# Patient Record
Sex: Female | Born: 1940 | Race: White | Hispanic: No | Marital: Married | Smoking: Never smoker
Health system: Southern US, Community
[De-identification: ages and names within clinical notes are randomized; demographics above are authoritative.]

---

## 2008-05-14 ENCOUNTER — Ambulatory Visit: Payer: Self-pay | Admitting: Internal Medicine

## 2014-08-26 DIAGNOSIS — M5137 Other intervertebral disc degeneration, lumbosacral region: Secondary | ICD-10-CM | POA: Insufficient documentation

## 2015-03-03 DIAGNOSIS — E038 Other specified hypothyroidism: Secondary | ICD-10-CM | POA: Insufficient documentation

## 2015-04-07 ENCOUNTER — Other Ambulatory Visit: Payer: Self-pay | Admitting: Physical Medicine and Rehabilitation

## 2015-04-07 DIAGNOSIS — M5417 Radiculopathy, lumbosacral region: Secondary | ICD-10-CM

## 2015-05-03 ENCOUNTER — Ambulatory Visit
Admission: RE | Admit: 2015-05-03 | Discharge: 2015-05-03 | Disposition: A | Discharge status: Home / Self Care | Payer: Medicare Other | Source: Ambulatory Visit | Attending: Physical Medicine and Rehabilitation | Admitting: Physical Medicine and Rehabilitation

## 2015-05-03 DIAGNOSIS — M5417 Radiculopathy, lumbosacral region: Secondary | ICD-10-CM

## 2015-05-11 DIAGNOSIS — M48062 Spinal stenosis, lumbar region with neurogenic claudication: Secondary | ICD-10-CM | POA: Insufficient documentation

## 2015-05-11 DIAGNOSIS — M5416 Radiculopathy, lumbar region: Secondary | ICD-10-CM | POA: Insufficient documentation

## 2016-03-27 ENCOUNTER — Other Ambulatory Visit: Payer: Self-pay | Admitting: Nurse Practitioner

## 2016-03-27 DIAGNOSIS — Z1231 Encounter for screening mammogram for malignant neoplasm of breast: Secondary | ICD-10-CM

## 2016-04-05 ENCOUNTER — Ambulatory Visit
Admission: RE | Admit: 2016-04-05 | Discharge: 2016-04-05 | Disposition: A | Discharge status: Home / Self Care | Payer: Medicare Other | Source: Ambulatory Visit | Attending: Nurse Practitioner | Admitting: Nurse Practitioner

## 2016-04-05 DIAGNOSIS — Z1231 Encounter for screening mammogram for malignant neoplasm of breast: Secondary | ICD-10-CM | POA: Diagnosis present

## 2017-04-11 ENCOUNTER — Other Ambulatory Visit: Payer: Self-pay | Admitting: Nurse Practitioner

## 2017-04-11 DIAGNOSIS — Z1231 Encounter for screening mammogram for malignant neoplasm of breast: Secondary | ICD-10-CM

## 2020-04-22 DIAGNOSIS — R7302 Impaired glucose tolerance (oral): Secondary | ICD-10-CM | POA: Insufficient documentation

## 2020-05-23 DIAGNOSIS — I6521 Occlusion and stenosis of right carotid artery: Secondary | ICD-10-CM | POA: Insufficient documentation

## 2020-08-03 ENCOUNTER — Encounter (INDEPENDENT_AMBULATORY_CARE_PROVIDER_SITE_OTHER): Payer: Self-pay | Admitting: Vascular Surgery

## 2020-08-24 ENCOUNTER — Encounter (INDEPENDENT_AMBULATORY_CARE_PROVIDER_SITE_OTHER): Payer: Self-pay | Admitting: Vascular Surgery

## 2020-08-31 ENCOUNTER — Other Ambulatory Visit: Payer: Self-pay

## 2020-08-31 ENCOUNTER — Ambulatory Visit (INDEPENDENT_AMBULATORY_CARE_PROVIDER_SITE_OTHER): Payer: Medicare Other | Admitting: Vascular Surgery

## 2020-08-31 ENCOUNTER — Encounter (INDEPENDENT_AMBULATORY_CARE_PROVIDER_SITE_OTHER): Payer: Self-pay | Admitting: Vascular Surgery

## 2020-08-31 VITALS — BP 157/74 | HR 78 | Ht 64.0 in | Wt 143.0 lb

## 2020-08-31 DIAGNOSIS — I10 Essential (primary) hypertension: Secondary | ICD-10-CM | POA: Insufficient documentation

## 2020-08-31 DIAGNOSIS — G4700 Insomnia, unspecified: Secondary | ICD-10-CM | POA: Insufficient documentation

## 2020-08-31 DIAGNOSIS — E785 Hyperlipidemia, unspecified: Secondary | ICD-10-CM | POA: Insufficient documentation

## 2020-08-31 DIAGNOSIS — F1190 Opioid use, unspecified, uncomplicated: Secondary | ICD-10-CM | POA: Insufficient documentation

## 2020-08-31 DIAGNOSIS — I6521 Occlusion and stenosis of right carotid artery: Secondary | ICD-10-CM

## 2020-08-31 NOTE — Progress Notes (Signed)
Patient ID: Kelli Bowen, female   DOB: 06/11/1941, 79 y.o.   MRN: [id number]  No chief complaint on file.   HPI Kelli Bowen is a 79 y.o. female.  I am asked to see the patient by Karna Dupes, NP for evaluation of carotid stenosis.  The patient reports no symptoms of cerebrovascular ischemia. Specifically, the patient denies amaurosis fugax, speech or swallowing difficulties, or arm or leg weakness or numbness. On her regular office visit they noted a bruit in the right neck which prompted a duplex which I have reviewed. This has significantly elevated velocities that would be consistent with a 70 to 99% stenosis of the right carotid artery. The left carotid artery had minimal stenosis. She is on a statin agent. She is referred for further evaluation and treatment.    Past Medical History Hyperlipidemia Arthritis Hypertension  No past surgical history on file.  Family History No bleeding disorders, clotting disorders, autoimmune diseases, or aneurysms.  Social History No alcohol, drug use, or tobacco. Retired.   Current Outpatient Medications  Medication Sig Dispense Refill  . atorvastatin (LIPITOR) 40 MG tablet Take 1 tablet by mouth daily.    Marland Kitchen atorvastatin (LIPITOR) 40 MG tablet Take 40 mg by mouth daily.    . Calcium Carbonate-Vitamin D 600-200 MG-UNIT TABS Take 1 tablet by mouth daily.    . clonazePAM (KLONOPIN) 0.25 MG disintegrating tablet Take by mouth.    . cyanocobalamin 1000 MCG tablet Take by mouth.    . diclofenac (VOLTAREN) 75 MG EC tablet Take by mouth.    . gabapentin (NEURONTIN) 100 MG capsule TAKE 2 CAPSULES BY MOUTH EVERY NIGHT    . levothyroxine (SYNTHROID) 75 MCG tablet TAKE 1 TABLET BY MOUTH 3 TIMES A WEEK ALTERNATING WITH 50 MCG ON OTHER 4 DAYS PER WEEK    . Multiple Vitamin (MULTI-VITAMIN) tablet Take 1 tablet by mouth daily.    . traMADol (ULTRAM) 50 MG tablet Take 50 mg by mouth 3 (three) times daily as needed.    . traZODone (DESYREL) 50 MG  tablet Take 50 mg by mouth at bedtime.    . triamterene-hydrochlorothiazide (DYAZIDE) 37.5-25 MG capsule Take 1 capsule by mouth daily.     No current facility-administered medications for this visit.      REVIEW OF SYSTEMS (Negative unless checked)  Constitutional: [] Weight loss  [] Fever  [] Chills Cardiac: [] Chest pain   [] Chest pressure   [] Palpitations   [] Shortness of breath when laying flat   [] Shortness of breath at rest   [] Shortness of breath with exertion. Vascular:  [] Pain in legs with walking   [] Pain in legs at rest   [] Pain in legs when laying flat   [] Claudication   [] Pain in feet when walking  [] Pain in feet at rest  [] Pain in feet when laying flat   [] History of DVT   [] Phlebitis   [] Swelling in legs   [] Varicose veins   [] Non-healing ulcers Pulmonary:   [] Uses home oxygen   [] Productive cough   [] Hemoptysis   [] Wheeze  [] COPD   [] Asthma Neurologic:  [] Dizziness  [] Blackouts   [] Seizures   [] History of stroke   [] History of TIA  [] Aphasia   [] Temporary blindness   [] Dysphagia   [] Weakness or numbness in arms   [] Weakness or numbness in legs Musculoskeletal:  [x] Arthritis   [] Joint swelling   [] Joint pain   [] Low back pain Hematologic:  [] Easy bruising  [] Easy bleeding   [] Hypercoagulable state   [] Anemic  []   Hepatitis Gastrointestinal:  [] Blood in stool   [] Vomiting blood  [x] Gastroesophageal reflux/heartburn   [] Abdominal pain Genitourinary:  [] Chronic kidney disease   [] Difficult urination  [] Frequent urination  [] Burning with urination   [] Hematuria Skin:  [] Rashes   [] Ulcers   [] Wounds Psychological:  [] History of anxiety   []  History of major depression.    Physical Exam BP (!) 157/74   Pulse 78   Ht 5\' 4"  (1.626 m)   Wt 143 lb (64.9 kg)   BMI 24.55 kg/m  Gen:  WD/WN, NAD. Appears younger than stated age. Head: [place]/AT, No temporalis wasting. Ear/Nose/Throat: Hearing grossly intact, nares w/o erythema or drainage, oropharynx w/o Erythema/Exudate Eyes: Conjunctiva  clear, sclera non-icteric  Neck: trachea midline.  Right carotid bruit is present. Pulmonary:  Good air movement, clear to auscultation bilaterally.  Cardiac: RRR, normal S1, S2 Vascular:  Vessel Right Left  Radial Palpable Palpable               Musculoskeletal: M/S 5/5 throughout.  Extremities without ischemic changes.  No deformity or atrophy. No edema. Neurologic: Sensation grossly intact in extremities.  Symmetrical.  Speech is fluent. Motor exam as listed above. Psychiatric: Judgment intact, Mood & affect appropriate for pt's clinical situation. Dermatologic: No rashes or ulcers noted.  No cellulitis or open wounds.    Radiology No results found.  Labs No results found for this or any previous visit (from the past 2160 hour(s)).  Assessment/Plan:  Carotid stenosis, right The patient remains asymptomatic with respect to the carotid stenosis.  However, the patient has now progressed and has a lesion the is >70%.  Patient should undergo CT angiography of the carotid arteries to define the degree of stenosis of the internal carotid arteries bilaterally and the anatomic suitability for surgery vs. intervention.  If the patient does indeed need surgery cardiac clearance will be required, once cleared the patient will be scheduled for surgery.  The risks, benefits and alternative therapies were reviewed in detail with the patient.  All questions were answered.  The patient agrees to proceed with imaging.  Continue antiplatelet therapy as prescribed. Continue management of CAD, HTN and Hyperlipidemia. Healthy heart diet, encouraged exercise at least 4 times per week.    Hypertension blood pressure control important in reducing the progression of atherosclerotic disease. On appropriate oral medications.   Hyperlipidemia lipid control important in reducing the progression of atherosclerotic disease. Continue statin therapy       08/31/2020, 5:04 PM   This  note was created with Dragon medical transcription system.  Any errors from dictation are unintentional.

## 2020-08-31 NOTE — Patient Instructions (Signed)
Carotid Artery Disease  Carotid artery disease is the narrowing or blockage of one or both carotid arteries. This condition is also called carotid artery stenosis. The carotid arteries are the two main blood vessels on either side of the neck. They send blood to the brain, other parts of the head, and the neck.  This condition increases your risk for a stroke or a transient ischemic attack (TIA). A TIA is a "mini-stroke" that causes stroke-like symptoms that go away quickly. What are the causes? This condition is mainly caused by a narrowing and hardening of the carotid arteries. The carotid arteries can become narrow or clogged with a buildup of plaque. Plaque includes:  Fat.  Cholesterol.  Calcium.  Other substances. What increases the risk? The following factors may make you more likely to develop this condition:  Having certain medical conditions, such as: ? High cholesterol. ? High blood pressure. ? Diabetes. ? Obesity.  Smoking.  A family history of cardiovascular disease.  Not being active or lack of regular exercise.  Being female. Men have a higher risk of having arteries become narrow and harden earlier in life than women.  Old age. What are the signs or symptoms? This condition may not have any signs or symptoms until a stroke or TIA happens. In some cases, your doctor may be able to hear a whooshing sound. This can suggest a change in blood flow caused by plaque buildup. An eye exam can also help find signs of the condition. How is this treated? This condition may be treated with more than one treatment. Treatment options include:  Lifestyle changes, such as: ? Quitting smoking. ? Getting regular exercise, or getting exercise as told by your doctor. ? Eating a healthy diet. ? Managing stress. ? Keeping a healthy weight.  Medicines to control: ? Blood pressure. ? Cholesterol. ? Blood clotting.  Surgery. You may have: ? A surgery to remove the blockages in  the carotid arteries. ? A procedure in which a small mesh tube (stent) is used to widen the blocked carotid arteries. Follow these instructions at home: Eating and drinking Follow instructions about your diet from your doctor. It is important to follow a healthy diet.  Eat a diet that includes: ? A lot of fresh fruits and vegetables. ? Low-fat (lean) meats.  Avoid these foods: ? Foods that are high in fat. ? Foods that are high in salt (sodium). ? Foods that are fried. ? Foods that are processed. ? Foods that have few good nutrients (poor nutritional value).  Lifestyle   Keep a healthy weight.  Do exercises as told by your doctor to stay active. Each week, you should get one of the following: ? At least 150 minutes of exercise that raises your heart rate and makes you sweat (moderate-intensity exercise). ? At least 75 minutes of exercise that takes a lot of effort.  Do not use any products that contain nicotine or tobacco, such as cigarettes, e-cigarettes, and chewing tobacco. If you need help quitting, ask your doctor.  Do not drink alcohol if: ? Your doctor tells you not to drink. ? You are pregnant, may be pregnant, or are planning to become pregnant.  If you drink alcohol: ? Limit how much you use to:  0-1 drink a day for women.  0-2 drinks a day for men. ? Be aware of how much alcohol is in your drink. In the [place] one drink equals one 12 oz bottle of beer (355 mL), one 5   oz glass of wine (148 mL), or one 1 oz glass of hard liquor (44 mL).  Do not use drugs.  Manage your stress. Ask your doctor for tips on how to do this. General instructions  Take over-the-counter and prescription medicines only as told by your doctor.  Keep all follow-up visits as told by your doctor. This is important. Where to find more information  American Heart Association: www.heart.org Get help right away if:  You have any signs of a stroke. "BE FAST" is an easy way to remember the  main warning signs: ? B - Balance. Signs are dizziness, sudden trouble walking, or loss of balance. ? E - Eyes. Signs are trouble seeing or a change in how you see. ? F - Face. Signs are sudden weakness or loss of feeling of the face, or the face or eyelid drooping on one side. ? A - Arms. Signs are weakness or loss of feeling in an arm. This happens suddenly and usually on one side of the body. ? S - Speech. Signs are sudden trouble speaking, slurred speech, or trouble understanding what people say. ? T - Time. Time to call emergency services. Write down what time symptoms started.  You have other signs of a stroke, such as: ? A sudden, very bad headache with no known cause. ? Feeling like you may vomit (nausea). ? Vomiting. ? A seizure. These symptoms may be an emergency. Do not wait to see if the symptoms will go away. Get medical help right away. Call your local emergency services (911 in the [place] Do not drive yourself to the hospital. Summary  The carotid arteries are blood vessels on both sides of the neck.  If these arteries get smaller or get blocked, you are more likely to have a stroke or a mini-stroke.  This condition can be treated with lifestyle changes, medicines, surgery, or a blend of these treatments.  Get help right away if you have any signs of a stroke. "BE FAST" is an easy way to remember the main warning signs of stroke. This information is not intended to replace advice given to you by your health care provider. Make sure you discuss any questions you have with your health care provider. Document Revised: 05/05/2019 Document Reviewed: 05/05/2019 Elsevier Patient Education  2020 [employer]  

## 2020-08-31 NOTE — Assessment & Plan Note (Signed)
lipid control important in reducing the progression of atherosclerotic disease. Continue statin therapy  

## 2020-08-31 NOTE — Assessment & Plan Note (Signed)
blood pressure control important in reducing the progression of atherosclerotic disease. On appropriate oral medications.  

## 2020-08-31 NOTE — Assessment & Plan Note (Signed)
The patient remains asymptomatic with respect to the carotid stenosis.  However, the patient has now progressed and has a lesion the is >70%.  Patient should undergo CT angiography of the carotid arteries to define the degree of stenosis of the internal carotid arteries bilaterally and the anatomic suitability for surgery vs. intervention.  If the patient does indeed need surgery cardiac clearance will be required, once cleared the patient will be scheduled for surgery.  The risks, benefits and alternative therapies were reviewed in detail with the patient.  All questions were answered.  The patient agrees to proceed with imaging.  Continue antiplatelet therapy as prescribed. Continue management of CAD, HTN and Hyperlipidemia. Healthy heart diet, encouraged exercise at least 4 times per week.   

## 2020-09-09 ENCOUNTER — Ambulatory Visit: Payer: Medicare Other

## 2020-09-10 ENCOUNTER — Ambulatory Visit
Admission: RE | Admit: 2020-09-10 | Discharge: 2020-09-10 | Disposition: A | Discharge status: Home / Self Care | Payer: Medicare Other | Source: Ambulatory Visit | Attending: Vascular Surgery | Admitting: Vascular Surgery

## 2020-09-10 ENCOUNTER — Other Ambulatory Visit: Payer: Self-pay

## 2020-09-10 DIAGNOSIS — I6521 Occlusion and stenosis of right carotid artery: Secondary | ICD-10-CM | POA: Insufficient documentation

## 2020-09-10 LAB — POCT I-STAT CREATININE: Creatinine, Ser: 0.9 mg/dL (ref 0.44–1.00)

## 2020-09-10 MED ORDER — IOHEXOL 350 MG/ML SOLN
75.0000 mL | Freq: Once | INTRAVENOUS | Status: AC | PRN
  Administered 2020-09-10: 75 mL via INTRAVENOUS

## 2020-09-21 ENCOUNTER — Other Ambulatory Visit: Payer: Self-pay

## 2020-09-21 ENCOUNTER — Ambulatory Visit (INDEPENDENT_AMBULATORY_CARE_PROVIDER_SITE_OTHER): Payer: Medicare Other | Admitting: Vascular Surgery

## 2020-09-21 ENCOUNTER — Encounter (INDEPENDENT_AMBULATORY_CARE_PROVIDER_SITE_OTHER): Payer: Self-pay | Admitting: Vascular Surgery

## 2020-09-21 VITALS — BP 169/76 | HR 75 | Resp 16 | Ht 64.5 in | Wt 146.6 lb

## 2020-09-21 DIAGNOSIS — I6521 Occlusion and stenosis of right carotid artery: Secondary | ICD-10-CM

## 2020-09-21 DIAGNOSIS — E785 Hyperlipidemia, unspecified: Secondary | ICD-10-CM | POA: Diagnosis not present

## 2020-09-21 DIAGNOSIS — I10 Essential (primary) hypertension: Secondary | ICD-10-CM

## 2020-09-21 NOTE — Patient Instructions (Signed)
Angiogram  An angiogram is a procedure used to examine the blood vessels. In this procedure, contrast dye is injected through a long, thin tube (catheter) into an artery. X-rays are then taken, which show if there is a blockage or problem in a blood vessel. The catheter may be inserted in:  The groin area. This is the most common.  The fold of the arm, near the elbow.  The wrist. Tell a health care provider about:  Any allergies you have, including allergies to medicines, shellfish, contrast dye, or iodine.  All medicines you are taking, including vitamins, herbs, eye drops, creams, and over-the-counter medicines.  Any problems you or family members have had with anesthetic medicines.  Any blood disorders you have.  Any surgeries you have had.  Any medical conditions you have or have had, including any kidney problems or kidney failure.  Whether you are pregnant or may be pregnant.  Whether you are breastfeeding. What are the risks? Generally, this is a safe procedure. However, problems may occur, including:  Infection.  Bleeding and bruising.  Allergic reactions to medicines or dyes, including the contrast dye used.  Damage to nearby structures or organs, including damage to blood vessels and kidney damage from the contrast dye.  Blood clots that can lead to a stroke or heart attack. What happens before the procedure? Staying hydrated Follow instructions from your health care provider about hydration, which may include:  Up to 2 hours before the procedure - you may continue to drink clear liquids, such as water, clear fruit juice, black coffee, and plain tea.  Eating and drinking restrictions Follow instructions from your health care provider about eating and drinking, which may include:  8 hours before the procedure - stop eating heavy meals or foods, such as meat, fried foods, or fatty foods.  6 hours before the procedure - stop eating light meals or foods, such  as toast or cereal.  2 hours before the procedure - stop drinking clear liquids. Medicines Ask your health care provider about:  Changing or stopping your regular medicines. This is especially important if you are taking diabetes medicines or blood thinners.  Taking medicines such as aspirin and ibuprofen. These medicines can thin your blood. Do not take these medicines unless your health care provider tells you to take them.  Taking over-the-counter medicines, vitamins, herbs, and supplements. Surgery safety Ask your health care provider:  How your insertion site will be marked.  What steps will be taken to help prevent infection. These may include: ? Removing hair at the insertion site. ? Washing skin with a germ-killing soap. ? Taking antibiotic medicine. General instructions  Do not use any products that contain nicotine or tobacco for at least 4 weeks before the procedure. These products include cigarettes, e-cigarettes, and chewing tobacco. If you need help quitting, ask your health care provider.  You may have blood samples taken.  Plan to have someone take you home from the hospital or clinic.  If you will be going home right after the procedure, plan to have someone with you for 24 hours. What happens during the procedure?  You will lie on your back on an X-ray table. You may be strapped to the table if it is tilted.  An IV will be inserted into one of your veins.  Electrodes may be placed on your chest to monitor your heart rate during the procedure.  You will be given one or both of the following: ? A medicine to   help you relax (sedative). ? A medicine to numb the area where the catheter will be inserted (local anesthetic).  A small incision will be made for catheter insertion.  The catheter will be inserted into an artery using a guide wire. An X-ray procedure (fluoroscopy) will be used to help guide the catheter to the blood vessel to be examined.  A contrast  dye will then be injected into the catheter, and X-rays will be taken. The contrast will help to show where any narrowing or blockages are located in the blood vessels. You may feel flushed as the contrast dye is injected.  Tell your health care provider if you develop chest pain or trouble breathing.  After the fluoroscopy is complete, the catheter will be removed.  A bandage (dressing) will be placed over the site where the catheter was inserted. Pressure will be applied to help stop any bleeding.  The IV will be removed. The procedure may vary among health care providers and hospitals. What happens after the procedure?  Your blood pressure, heart rate, breathing rate, and blood oxygen level will be monitored until you leave the hospital or clinic.  You will be kept in bed lying flat for 6 hours. If the catheter was inserted through your leg, you will be instructed not to bend or cross your legs.  The insertion area and the pulse in your feet or wrist will be checked frequently.  You will be instructed to drink plenty of fluids. This will help wash the contrast dye out of your body.  You may have more blood tests and X-rays. You may also have a test that records the electrical activity of your heart (electrocardiogram, or ECG).  Do not drive for 24 hours if you were given a sedative during your procedure.  It is up to you to get the results of your procedure. Ask your health care provider, or the department that is doing the procedure, when your results will be ready. Summary  An angiogram is a procedure used to examine the blood vessels.  Before the procedure, follow your health care provider's instructions about eating and drinking restrictions. You may be asked to stop eating and drinking several hours before the procedure.  During the procedure, contrast dye is injected through a thin tube (catheter) into an artery. X-rays are then taken.  After the procedure, you will need to  drink plenty of fluids and lie flat for 6 hour. This information is not intended to replace advice given to you by your health care provider. Make sure you discuss any questions you have with your health care provider. Document Revised: 04/23/2019 Document Reviewed: 04/23/2019 Elsevier Patient Education  2020 [employer]  

## 2020-09-21 NOTE — Progress Notes (Signed)
MRN : [id number]  Kelli Bowen is a 79 y.o. ([birth year]) female who presents with chief complaint of  Chief Complaint  Patient presents with  . Follow-up    ct results  .  History of Present Illness: Patient returns today in follow up of her carotid disease.  She remains asymptomatic since her last visit with no new symptoms.  She is doing well today.  She continues on aspirin and Lipitor.  No new complaints. I have independently reviewed her CT angiogram.  The official report was of less than 50% carotid artery stenosis bilaterally.  Although the right carotid artery does not appear as high-grade as the ultrasound suggesting, I do believe it is significantly more than very mild disease that is described in the official report.  I would estimate somewhere in the 60 to 70% range if not a little worse.  Current Outpatient Medications  Medication Sig Dispense Refill  . aspirin EC 81 MG tablet Take 81 mg by mouth daily. Swallow whole.    Marland Kitchen atorvastatin (LIPITOR) 40 MG tablet Take 1 tablet by mouth daily.    Marland Kitchen atorvastatin (LIPITOR) 40 MG tablet Take 40 mg by mouth daily.    . Calcium Carbonate-Vitamin D 600-200 MG-UNIT TABS Take 1 tablet by mouth daily.    . clonazePAM (KLONOPIN) 0.25 MG disintegrating tablet Take by mouth.    . cyanocobalamin 1000 MCG tablet Take by mouth.    . diclofenac (VOLTAREN) 75 MG EC tablet Take by mouth.    . gabapentin (NEURONTIN) 100 MG capsule TAKE 2 CAPSULES BY MOUTH EVERY NIGHT    . levothyroxine (SYNTHROID) 75 MCG tablet TAKE 1 TABLET BY MOUTH 3 TIMES A WEEK ALTERNATING WITH 50 MCG ON OTHER 4 DAYS PER WEEK    . Multiple Vitamin (MULTI-VITAMIN) tablet Take 1 tablet by mouth daily.    . traMADol (ULTRAM) 50 MG tablet Take 50 mg by mouth 3 (three) times daily as needed.    . traZODone (DESYREL) 50 MG tablet Take 50 mg by mouth at bedtime.    . triamterene-hydrochlorothiazide (DYAZIDE) 37.5-25 MG capsule Take 1 capsule by mouth daily.     No current  facility-administered medications for this visit.    No past medical history on file.  No past surgical history on file.   Social History   Tobacco Use  . Smoking status: Never Smoker  . Smokeless tobacco: Never Used  Substance Use Topics  . Alcohol use: Not on file  . Drug use: Not on file  married    Family History  Problem Relation Age of Onset  . Breast cancer Neg Hx   No bleeding or clotting disorders No aneurysms   No Known Allergies   REVIEW OF SYSTEMS (Negative unless checked)  Constitutional: [] Weight loss  [] Fever  [] Chills Cardiac: [] Chest pain   [] Chest pressure   [] Palpitations   [] Shortness of breath when laying flat   [] Shortness of breath at rest   [] Shortness of breath with exertion. Vascular:  [] Pain in legs with walking   [] Pain in legs at rest   [] Pain in legs when laying flat   [] Claudication   [] Pain in feet when walking  [] Pain in feet at rest  [] Pain in feet when laying flat   [] History of DVT   [] Phlebitis   [] Swelling in legs   [] Varicose veins   [] Non-healing ulcers Pulmonary:   [] Uses home oxygen   [] Productive cough   [] Hemoptysis   [] Wheeze  [] COPD   []   Asthma Neurologic:  [] Dizziness  [] Blackouts   [] Seizures   [] History of stroke   [] History of TIA  [] Aphasia   [] Temporary blindness   [] Dysphagia   [] Weakness or numbness in arms   [] Weakness or numbness in legs Musculoskeletal:  [x] Arthritis   [] Joint swelling   [x] Joint pain   [] Low back pain Hematologic:  [] Easy bruising  [] Easy bleeding   [] Hypercoagulable state   [] Anemic   Gastrointestinal:  [] Blood in stool   [] Vomiting blood  [x] Gastroesophageal reflux/heartburn   [] Abdominal pain Genitourinary:  [] Chronic kidney disease   [] Difficult urination  [] Frequent urination  [] Burning with urination   [] Hematuria Skin:  [] Rashes   [] Ulcers   [] Wounds Psychological:  [] History of anxiety   []  History of major depression.  Physical Examination  BP (!) 169/76 (BP Location: Right Arm)   Pulse  75   Resp 16   Ht 5' 4.5" (1.638 m)   Wt 146 lb 9.6 oz (66.5 kg)   BMI 24.78 kg/m  Gen:  WD/WN, NAD Head: [place]/AT, No temporalis wasting. Ear/Nose/Throat: Hearing grossly intact, nares w/o erythema or drainage Eyes: Conjunctiva clear. Sclera non-icteric Neck: Supple.  Trachea midline Pulmonary:  Good air movement, no use of accessory muscles.  Cardiac: RRR, no JVD Vascular:  Vessel Right Left  Radial Palpable Palpable           Musculoskeletal: M/S 5/5 throughout.  No deformity or atrophy. No edema. Neurologic: Sensation grossly intact in extremities.  Symmetrical.  Speech is fluent.  Psychiatric: Judgment intact, Mood & affect appropriate for pt's clinical situation. Dermatologic: No rashes or ulcers noted.  No cellulitis or open wounds.       Labs Recent Results (from the past 2160 hour(s))  I-STAT creatinine     Status: None   Collection Time: 09/10/20  4:18 PM  Result Value Ref Range   Creatinine, Ser 0.90 0.44 - 1.00 mg/dL    Radiology CT Angio Neck W/Cm &/Or Wo/Cm  Result Date: 09/11/2020 CLINICAL DATA:  Carotid stenosis screening. EXAM: CT ANGIOGRAPHY NECK TECHNIQUE: Multidetector CT imaging of the neck was performed using the standard protocol during bolus administration of intravenous contrast. Multiplanar CT image reconstructions and MIPs were obtained to evaluate the vascular anatomy. Carotid stenosis measurements (when applicable) are obtained utilizing NASCET criteria, using the distal internal carotid diameter as the denominator. CONTRAST:  87mL OMNIPAQUE IOHEXOL 350 MG/ML SOLN COMPARISON:  Carotid duplex June 08, 2020. FINDINGS: Aortic arch: Only partially visualized. Origin of innominate artery out of the field of view. No stenosis at the origin of the left subclavian and left common carotid arteries. Right carotid system: Atherosclerotic changes of the right carotid bifurcation with approximately 40% stenosis at the distal right common carotid artery. Left  carotid system: Atherosclerotic changes of the left carotid bifurcation without hemodynamically significant stenosis. Vertebral arteries: Atherosclerotic changes at the origin of the right vertebral artery resulting in mild stenosis with increased tortuosity of the V1 segment. Decrease caliber of the right vertebral artery from the V3-4 junction. The left vertebral artery is dominant. Atherosclerotic changes at the origin of the left vertebral artery without significant stenosis. Increased tortuosity of the V1 segment. Skeleton: Advanced degenerative changes of the cervical spine. Other neck: Heterogeneous enhancement of the thyroid gland. Upper chest: Biapical pleural pulmonary fibrosis. IMPRESSION: 1. Atherosclerotic changes at the carotid bifurcations bilaterally with less than 50% stenosis at the distal right common carotid artery. No hemodynamically significant stenosis at the left ICA. 2. Mild stenosis at the origin of the  right vertebral artery. Decrease caliber of the intracranial segment of the right vertebral artery. 3. Heterogeneous enhancement of the thyroid gland. Recommend thyroid ultrasound for further evaluation. Electronically Signed   By: Baldemar Lenis M.D.   On: 09/11/2020 17:18    Assessment/Plan Hypertension blood pressure control important in reducing the progression of atherosclerotic disease. On appropriate oral medications.   Hyperlipidemia lipid control important in reducing the progression of atherosclerotic disease. Continue statin therapy  Carotid stenosis, right I have independently reviewed her CT angiogram.  The official report was of less than 50% carotid artery stenosis bilaterally.  Although the right carotid artery does not appear as high-grade as the ultrasound suggesting, I do believe it is significantly more than very mild disease that is described in the official report.  I had a good long discussion with she and her family today regarding the  disparate findings between ultrasound and CT scan.  I believe that we have 2 options in this situation.  1 would be a repeat carotid duplex by a dedicated vascular technologist in our office that I have more faith in.  That still may lead to disparate findings however.  The second option would be an angiogram of the carotid arteries for further evaluation.  If significant disease of greater than 75% is present, she would have anatomy that would be amenable to stenting at that time.  I discussed the risks and benefits of angiogram and possible stent placement.  I discussed the reason and rationale to gather more information for definitive treatment.  She and her family would like to proceed with the angiogram and possible stent placement after the holidays.    Festus Barren, MD  09/21/2020 4:19 PM    This note was created with Dragon medical transcription system.  Any errors from dictation are purely unintentional

## 2020-09-21 NOTE — Assessment & Plan Note (Signed)
I have independently reviewed her CT angiogram.  The official report was of less than 50% carotid artery stenosis bilaterally.  Although the right carotid artery does not appear as high-grade as the ultrasound suggesting, I do believe it is significantly more than very mild disease that is described in the official report.  I had a good long discussion with she and her family today regarding the disparate findings between ultrasound and CT scan.  I believe that we have 2 options in this situation.  1 would be a repeat carotid duplex by a dedicated vascular technologist in our office that I have more faith in.  That still may lead to disparate findings however.  The second option would be an angiogram of the carotid arteries for further evaluation.  If significant disease of greater than 75% is present, she would have anatomy that would be amenable to stenting at that time.  I discussed the risks and benefits of angiogram and possible stent placement.  I discussed the reason and rationale to gather more information for definitive treatment.  She and her family would like to proceed with the angiogram and possible stent placement after the holidays.

## 2020-09-22 ENCOUNTER — Telehealth (INDEPENDENT_AMBULATORY_CARE_PROVIDER_SITE_OTHER): Payer: Self-pay

## 2020-09-22 NOTE — Telephone Encounter (Signed)
I attempted to contact the patient and a message was left for a return call. 

## 2020-09-30 NOTE — Telephone Encounter (Signed)
I attempted to contact the patient and a message was left on her home voice mail box for a return call. I attempted to contact the mobile number and the voicemail box is full.

## 2021-04-08 IMAGING — CT CT ANGIO NECK
2 of 7 series · 7 of 33 positions shown · IV contrast (omnipaque)
Comparison: Carotid duplex June 08, 2020.

CLINICAL DATA: Carotid stenosis screening.

EXAM:
CT ANGIOGRAPHY NECK
TECHNIQUE: Multidetector CT imaging of the neck was performed using the
standard protocol during bolus administration of intravenous
contrast. Multiplanar CT image reconstructions and MIPs were
obtained to evaluate the vascular anatomy. Carotid stenosis
measurements (when applicable) are obtained utilizing NASCET
criteria, using the distal internal carotid diameter as the
denominator.
CONTRAST:  75mL OMNIPAQUE IOHEXOL 350 MG/ML SOLN

[Series 7: ax thin mips cta neck 1.00 ax · axial · 0.40mm/px · z∈[-729,-530]mm · 6 of 328 slices shown]
[im 47/328  soft-tissue]
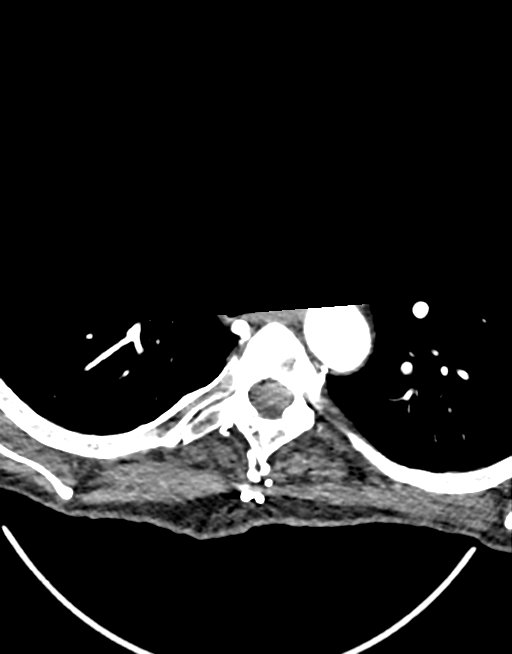
[im 94/328  bone]
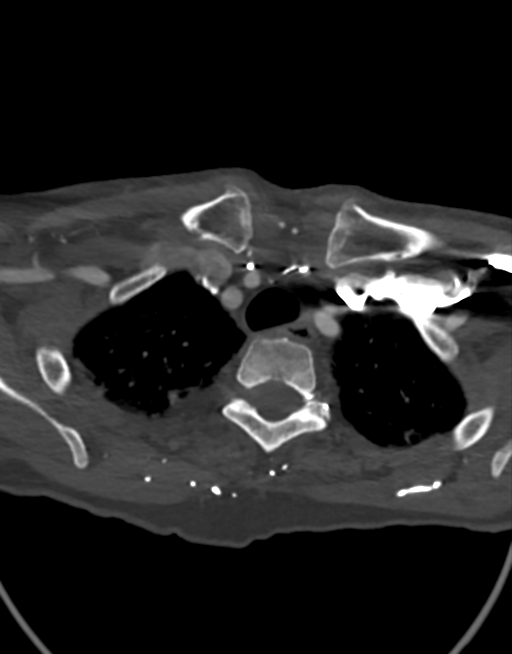
[im 141/328  soft-tissue]
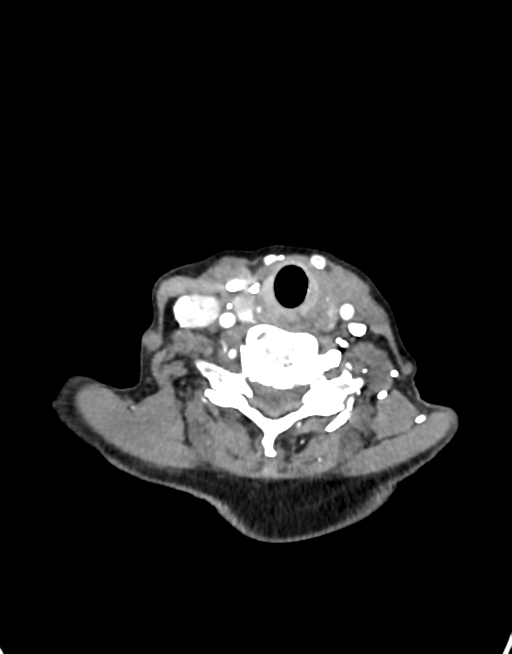
[im 187/328  bone]
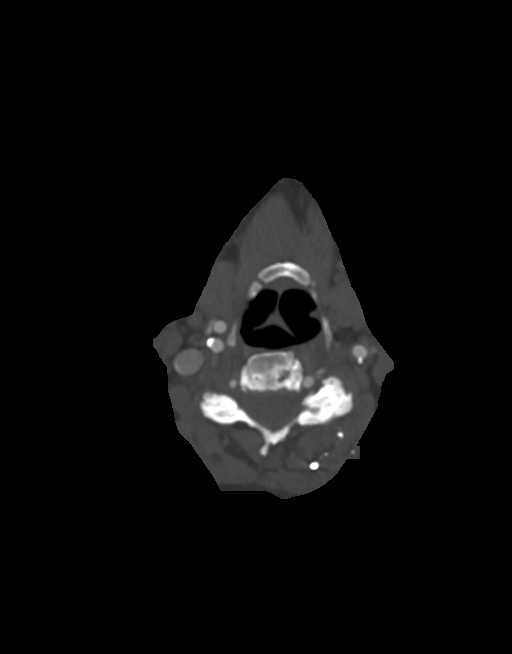
[im 234/328  soft-tissue]
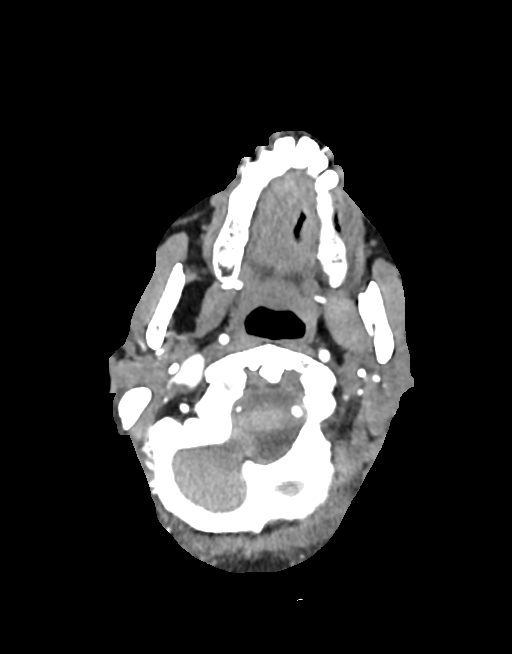
[im 281/328  bone]
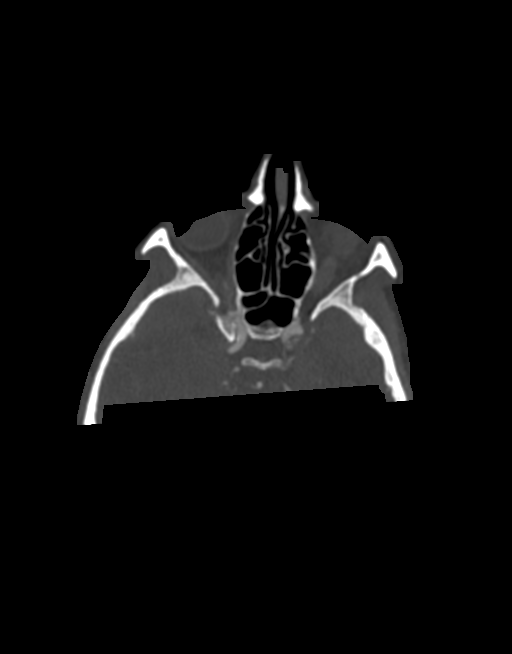

[Series 11: sag thin mips cta neck 1.00 sag · sagittal · 0.51mm/px · 1 of 204 slices shown]
[im 102/204  soft-tissue]
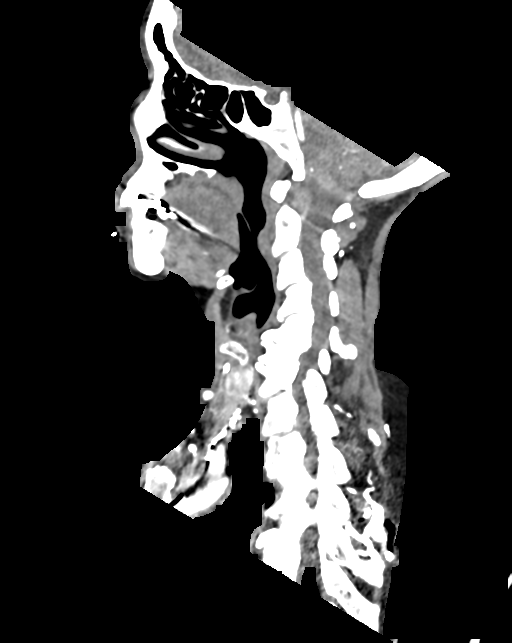

[7 of 33 positions shown; findings below may reference images not displayed]

FINDINGS: Aortic arch: Only partially visualized. Origin of innominate artery
out of the field of view. No stenosis at the origin of the left
subclavian and left common carotid arteries.

Right carotid system: Atherosclerotic changes of the right carotid
bifurcation with approximately 40% stenosis at the distal right
common carotid artery.

Left carotid system: Atherosclerotic changes of the left carotid
bifurcation without hemodynamically significant stenosis.

Vertebral arteries: Atherosclerotic changes at the origin of the
right vertebral artery resulting in mild stenosis with increased
tortuosity of the V1 segment. Decrease caliber of the right
vertebral artery from the [REDACTED] junction. The left vertebral artery
is dominant.

Atherosclerotic changes at the origin of the left vertebral artery
without significant stenosis. Increased tortuosity of the V1
segment.

Skeleton: Advanced degenerative changes of the cervical spine.

Other neck: Heterogeneous enhancement of the thyroid gland.

Upper chest: Biapical pleural pulmonary fibrosis.
IMPRESSION: 1. Atherosclerotic changes at the carotid bifurcations bilaterally
with less than 50% stenosis at the distal right common carotid
artery. No hemodynamically significant stenosis at the left ICA.
2. Mild stenosis at the origin of the right vertebral artery.
Decrease caliber of the intracranial segment of the right vertebral
artery.
3. Heterogeneous enhancement of the thyroid gland. Recommend thyroid
ultrasound for further evaluation.

## 2021-08-26 ENCOUNTER — Telehealth (INDEPENDENT_AMBULATORY_CARE_PROVIDER_SITE_OTHER): Payer: Self-pay

## 2021-08-26 NOTE — Telephone Encounter (Signed)
ERROR

## 2021-08-26 NOTE — Telephone Encounter (Deleted)
ERROR

## 2021-11-18 ENCOUNTER — Telehealth (INDEPENDENT_AMBULATORY_CARE_PROVIDER_SITE_OTHER): Payer: Self-pay

## 2021-11-18 NOTE — Telephone Encounter (Signed)
I attempted to contact the patient to schedule her for a carotid angio with Dr. Wyn Quaker. A message was left for a return call. I attempted to contact per the mobile phone at [phone] and was told I had the wrong number.

## 2024-01-01 DIAGNOSIS — Z79891 Long term (current) use of opiate analgesic: Secondary | ICD-10-CM | POA: Diagnosis not present

## 2024-01-01 DIAGNOSIS — I129 Hypertensive chronic kidney disease with stage 1 through stage 4 chronic kidney disease, or unspecified chronic kidney disease: Secondary | ICD-10-CM | POA: Diagnosis not present

## 2024-01-01 DIAGNOSIS — R7302 Impaired glucose tolerance (oral): Secondary | ICD-10-CM | POA: Diagnosis not present

## 2024-01-01 DIAGNOSIS — I6523 Occlusion and stenosis of bilateral carotid arteries: Secondary | ICD-10-CM | POA: Diagnosis not present

## 2024-01-01 DIAGNOSIS — E782 Mixed hyperlipidemia: Secondary | ICD-10-CM | POA: Diagnosis not present

## 2024-01-01 DIAGNOSIS — Z79899 Other long term (current) drug therapy: Secondary | ICD-10-CM | POA: Diagnosis not present

## 2024-01-01 DIAGNOSIS — E039 Hypothyroidism, unspecified: Secondary | ICD-10-CM | POA: Diagnosis not present

## 2024-01-01 DIAGNOSIS — N1831 Chronic kidney disease, stage 3a: Secondary | ICD-10-CM | POA: Diagnosis not present

## 2024-01-01 DIAGNOSIS — M5416 Radiculopathy, lumbar region: Secondary | ICD-10-CM | POA: Diagnosis not present

## 2024-07-04 DIAGNOSIS — M5416 Radiculopathy, lumbar region: Secondary | ICD-10-CM | POA: Diagnosis not present

## 2024-07-04 DIAGNOSIS — Z1331 Encounter for screening for depression: Secondary | ICD-10-CM | POA: Diagnosis not present

## 2024-07-04 DIAGNOSIS — I129 Hypertensive chronic kidney disease with stage 1 through stage 4 chronic kidney disease, or unspecified chronic kidney disease: Secondary | ICD-10-CM | POA: Diagnosis not present

## 2024-07-04 DIAGNOSIS — E782 Mixed hyperlipidemia: Secondary | ICD-10-CM | POA: Diagnosis not present

## 2024-07-04 DIAGNOSIS — F1190 Opioid use, unspecified, uncomplicated: Secondary | ICD-10-CM | POA: Diagnosis not present

## 2024-07-04 DIAGNOSIS — Z0000 Encounter for general adult medical examination without abnormal findings: Secondary | ICD-10-CM | POA: Diagnosis not present

## 2024-07-04 DIAGNOSIS — I6523 Occlusion and stenosis of bilateral carotid arteries: Secondary | ICD-10-CM | POA: Diagnosis not present

## 2024-07-04 DIAGNOSIS — E039 Hypothyroidism, unspecified: Secondary | ICD-10-CM | POA: Diagnosis not present

## 2024-07-04 DIAGNOSIS — I10 Essential (primary) hypertension: Secondary | ICD-10-CM | POA: Diagnosis not present

## 2024-07-04 DIAGNOSIS — E785 Hyperlipidemia, unspecified: Secondary | ICD-10-CM | POA: Diagnosis not present

## 2024-07-04 DIAGNOSIS — R7302 Impaired glucose tolerance (oral): Secondary | ICD-10-CM | POA: Diagnosis not present

## 2024-07-04 DIAGNOSIS — N1831 Chronic kidney disease, stage 3a: Secondary | ICD-10-CM | POA: Diagnosis not present

## 2024-07-04 DIAGNOSIS — Z79899 Other long term (current) drug therapy: Secondary | ICD-10-CM | POA: Diagnosis not present

## 2024-08-15 DIAGNOSIS — Z79899 Other long term (current) drug therapy: Secondary | ICD-10-CM | POA: Diagnosis not present

## 2024-08-15 DIAGNOSIS — E039 Hypothyroidism, unspecified: Secondary | ICD-10-CM | POA: Diagnosis not present

## 2024-08-18 DIAGNOSIS — H6121 Impacted cerumen, right ear: Secondary | ICD-10-CM | POA: Diagnosis not present

## 2024-08-18 DIAGNOSIS — H903 Sensorineural hearing loss, bilateral: Secondary | ICD-10-CM | POA: Diagnosis not present
